# Patient Record
Sex: Male | Born: 1975 | Race: Black or African American | Hispanic: No | Marital: Married | Smoking: Former smoker
Health system: Southern US, Community
[De-identification: ages and names within clinical notes are randomized; demographics above are authoritative.]

## PROBLEM LIST (undated history)

## (undated) DIAGNOSIS — E7800 Pure hypercholesterolemia, unspecified: Secondary | ICD-10-CM

## (undated) HISTORY — PX: WISDOM TOOTH EXTRACTION: SHX21

---

## 2018-04-19 ENCOUNTER — Emergency Department (HOSPITAL_BASED_OUTPATIENT_CLINIC_OR_DEPARTMENT_OTHER): Payer: Managed Care, Other (non HMO)

## 2018-04-19 ENCOUNTER — Emergency Department (HOSPITAL_BASED_OUTPATIENT_CLINIC_OR_DEPARTMENT_OTHER)
Admission: EM | Admit: 2018-04-19 | Discharge: 2018-04-19 | Disposition: A | Discharge status: Home / Self Care | Payer: Managed Care, Other (non HMO) | Attending: Emergency Medicine | Admitting: Emergency Medicine

## 2018-04-19 ENCOUNTER — Encounter (HOSPITAL_BASED_OUTPATIENT_CLINIC_OR_DEPARTMENT_OTHER): Payer: Self-pay | Admitting: Emergency Medicine

## 2018-04-19 ENCOUNTER — Other Ambulatory Visit: Payer: Self-pay

## 2018-04-19 DIAGNOSIS — Z87891 Personal history of nicotine dependence: Secondary | ICD-10-CM | POA: Diagnosis not present

## 2018-04-19 DIAGNOSIS — R072 Precordial pain: Secondary | ICD-10-CM

## 2018-04-19 HISTORY — DX: Pure hypercholesterolemia, unspecified: E78.00

## 2018-04-19 LAB — CBC WITH DIFFERENTIAL/PLATELET
Abs Immature Granulocytes: 0 10*3/uL (ref 0.00–0.07)
Basophils Absolute: 0 10*3/uL (ref 0.0–0.1)
Basophils Relative: 1 %
EOS ABS: 0.3 10*3/uL (ref 0.0–0.5)
EOS PCT: 6 %
HEMATOCRIT: 46.6 % (ref 39.0–52.0)
Hemoglobin: 15.1 g/dL (ref 13.0–17.0)
Immature Granulocytes: 0 %
LYMPHS ABS: 1.5 10*3/uL (ref 0.7–4.0)
Lymphocytes Relative: 33 %
MCH: 30 pg (ref 26.0–34.0)
MCHC: 32.4 g/dL (ref 30.0–36.0)
MCV: 92.5 fL (ref 80.0–100.0)
MONOS PCT: 8 %
Monocytes Absolute: 0.4 10*3/uL (ref 0.1–1.0)
NEUTROS PCT: 52 %
Neutro Abs: 2.4 10*3/uL (ref 1.7–7.7)
PLATELETS: 229 10*3/uL (ref 150–400)
RBC: 5.04 MIL/uL (ref 4.22–5.81)
RDW: 12 % (ref 11.5–15.5)
WBC: 4.6 10*3/uL (ref 4.0–10.5)
nRBC: 0 % (ref 0.0–0.2)

## 2018-04-19 LAB — TROPONIN I: Troponin I: <0.03 ng/mL (ref <0.03)

## 2018-04-19 LAB — BASIC METABOLIC PANEL
ANION GAP: 6 (ref 5–15)
BUN: 17 mg/dL (ref 6–20)
CALCIUM: 9.7 mg/dL (ref 8.9–10.3)
CO2: 27 mmol/L (ref 22–32)
Chloride: 103 mmol/L (ref 98–111)
Creatinine, Ser: 1.03 mg/dL (ref 0.61–1.24)
GFR calc Af Amer: >60 mL/min (ref >60)
GLUCOSE: 85 mg/dL (ref 70–99)
Potassium: 4 mmol/L (ref 3.5–5.1)
Sodium: 136 mmol/L (ref 135–145)

## 2018-04-19 LAB — D-DIMER, QUANTITATIVE: D-Dimer, Quant: 0.29 ug/mL-FEU (ref 0.00–0.50)

## 2018-04-19 MED ORDER — NAPROXEN 250 MG PO TABS
500.0000 mg | ORAL_TABLET | Freq: Once | ORAL | Status: AC
  Administered 2018-04-19: 500 mg via ORAL
  Filled 2018-04-19: qty 2

## 2018-04-19 MED ORDER — GI COCKTAIL ~~LOC~~
30.0000 mL | Freq: Once | ORAL | Status: AC
  Administered 2018-04-19: 30 mL via ORAL
  Filled 2018-04-19: qty 30

## 2018-04-19 NOTE — ED Notes (Signed)
Patient transported to X-ray 

## 2018-04-19 NOTE — ED Triage Notes (Signed)
Pt reports chest pain x 30 mins pta. 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

## 2018-04-19 NOTE — ED Provider Notes (Signed)
[facility] EMERGENCY DEPARTMENT Provider Note   CSN: [id number] Arrival date & time: 04/19/18  0251     History   Chief Complaint Chief Complaint  Patient presents with  . Chest Pain    HPI Ralph Holt. is a 42 y.o. male.  The history is provided by the patient.  Chest Pain   This is a new problem. The current episode started less than 1 hour ago. The problem occurs constantly. The problem has been resolved. 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 The pain is present in the lateral region. The pain is moderate. The quality of the pain is described as brief. Pertinent negatives include no abdominal pain, no back pain, no claudication, no diaphoresis, no exertional chest pressure, no fever, no irregular heartbeat, no near-syncope, no numbness, no orthopnea, no palpitations, no shortness of breath and no vomiting. He has tried nothing for the symptoms. The treatment provided significant relief. Risk factors include male gender.  Pertinent negatives for past medical history include no arrhythmia, no CAD, no diabetes, no DVT, no hyperhomocysteinemia, no hypertension, no MI and no PE.  Pertinent negatives for family medical history include: no Marfan's syndrome.  Procedure history is negative for cardiac catheterization.  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  No leg pain.  No exertional symptoms.  No n/v/d.  No DOE.  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  No long car trips or plane trips.    Past Medical History:  Diagnosis Date  . High cholesterol     There are no active problems to display for this patient.   Past Surgical History:  Procedure Laterality Date  . WISDOM TOOTH EXTRACTION          Home Medications    Prior to Admission medications   Not on File    Family History No family history on file.  Social History Social History   Tobacco Use  . Smoking status: Former Smoker   Types: Cigarettes  . Smokeless tobacco: Never Used  Substance Use Topics  . Alcohol use: Not Currently  . Drug use: Never     Allergies   Patient has no known allergies.   Review of Systems Review of Systems  Constitutional: Negative for diaphoresis and fever.  Respiratory: Negative for chest tightness and shortness of breath.   Cardiovascular: Positive for chest pain. Negative for palpitations, orthopnea, claudication, leg swelling and near-syncope.  Gastrointestinal: Negative for abdominal pain and vomiting.  Musculoskeletal: Negative for back pain and neck pain.  Neurological: Negative for numbness.  All other systems reviewed and are negative.    Physical Exam Updated Vital Signs BP 124/86 (BP Location: Right Arm)   Pulse (!) 52   Temp 98.1 F (36.7 C) (Oral)   Resp 18   Ht 5\' 3"  (1.6 m)   Wt 76.2 kg   SpO2 100%   BMI 29.76 kg/m   Physical Exam  Constitutional: He is oriented to person, place, and time. He appears well-developed and well-nourished. No distress.  HENT:  Head: Normocephalic and atraumatic.  Mouth/Throat: No oropharyngeal exudate.  Eyes: Pupils are equal, round, and reactive to light. Conjunctivae are normal.  Neck: Normal range of motion. Neck supple.  Cardiovascular: Normal rate, regular rhythm, normal heart sounds and intact distal pulses.  Pulmonary/Chest: Effort normal and breath sounds normal. No stridor. No respiratory distress. He has no wheezes. He has no rales.  Abdominal: Soft.  Bowel sounds are normal. He exhibits no mass. There is no tenderness. There is no rebound and no guarding.  Musculoskeletal: Normal range of motion. He exhibits no edema or tenderness.  Neurological: He is alert and oriented to person, place, and time.  Skin: Skin is warm and dry. Capillary refill takes less than 2 seconds. He is not diaphoretic.  Psychiatric: He has a normal mood and affect.     ED Treatments / Results  Labs (all labs ordered are listed, but  only abnormal results are displayed) Labs Reviewed  CBC WITH DIFFERENTIAL/PLATELET  BASIC METABOLIC PANEL  TROPONIN I  D-DIMER, QUANTITATIVE (NOT AT [facility])    EKG EKG Interpretation  Date/Time:  Wednesday April 19 2018 03:00:01 EDT Ventricular Rate:  49 PR Interval:    QRS Duration: 88 QT Interval:  385 QTC Calculation: 348 R Axis:   81 Text Interpretation:  Sinus bradycardia Confirmed by Cecily Lawhorne ([id number]) on 04/19/2018 3:02:02 AM   Radiology No results found.  Procedures Procedures (including critical care time)  Medications Ordered in ED Medications - No data to display   Initial Impression / Assessment and Plan / ED Course   Ruled out for PE with negative ddimer in the ED. Also ruled out for MI with 2 negative troponins.  HEART score is 1 very low risk for MACE.  Symptoms are consistent with MSK pain pain and will adveise NSAIDS and heat.      Final Clinical Impressions(s) / ED Diagnoses   Return for weakness, numbness, changes in vision or speech, fevers >100.4 unrelieved by medication, shortness of breath, intractable vomiting, or diarrhea, abdominal pain, Inability to tolerate liquids or food, cough, altered mental status or any concerns. No signs of systemic illness or infection. The patient is nontoxic-appearing on exam and vital signs are within normal limits.    I have reviewed the triage vital signs and the nursing notes. Pertinent labs &imaging results that were available during my care of the patient were reviewed by me and considered in my medical decision making (see chart for details).  After history, exam, and medical workup I feel the patient has been appropriately medically screened and is safe for discharge home. Pertinent diagnoses were discussed with the patient. Patient was given return precautions.   Anu Stagner, MD 04/19/18 [phone]

## 2020-02-29 IMAGING — DX DG CHEST 2V
2 series · 2 of 2 positions shown · non-contrast
Comparison: None.

CLINICAL DATA: Chest pain

EXAM:
CHEST - 2 VIEW

[chest pa]
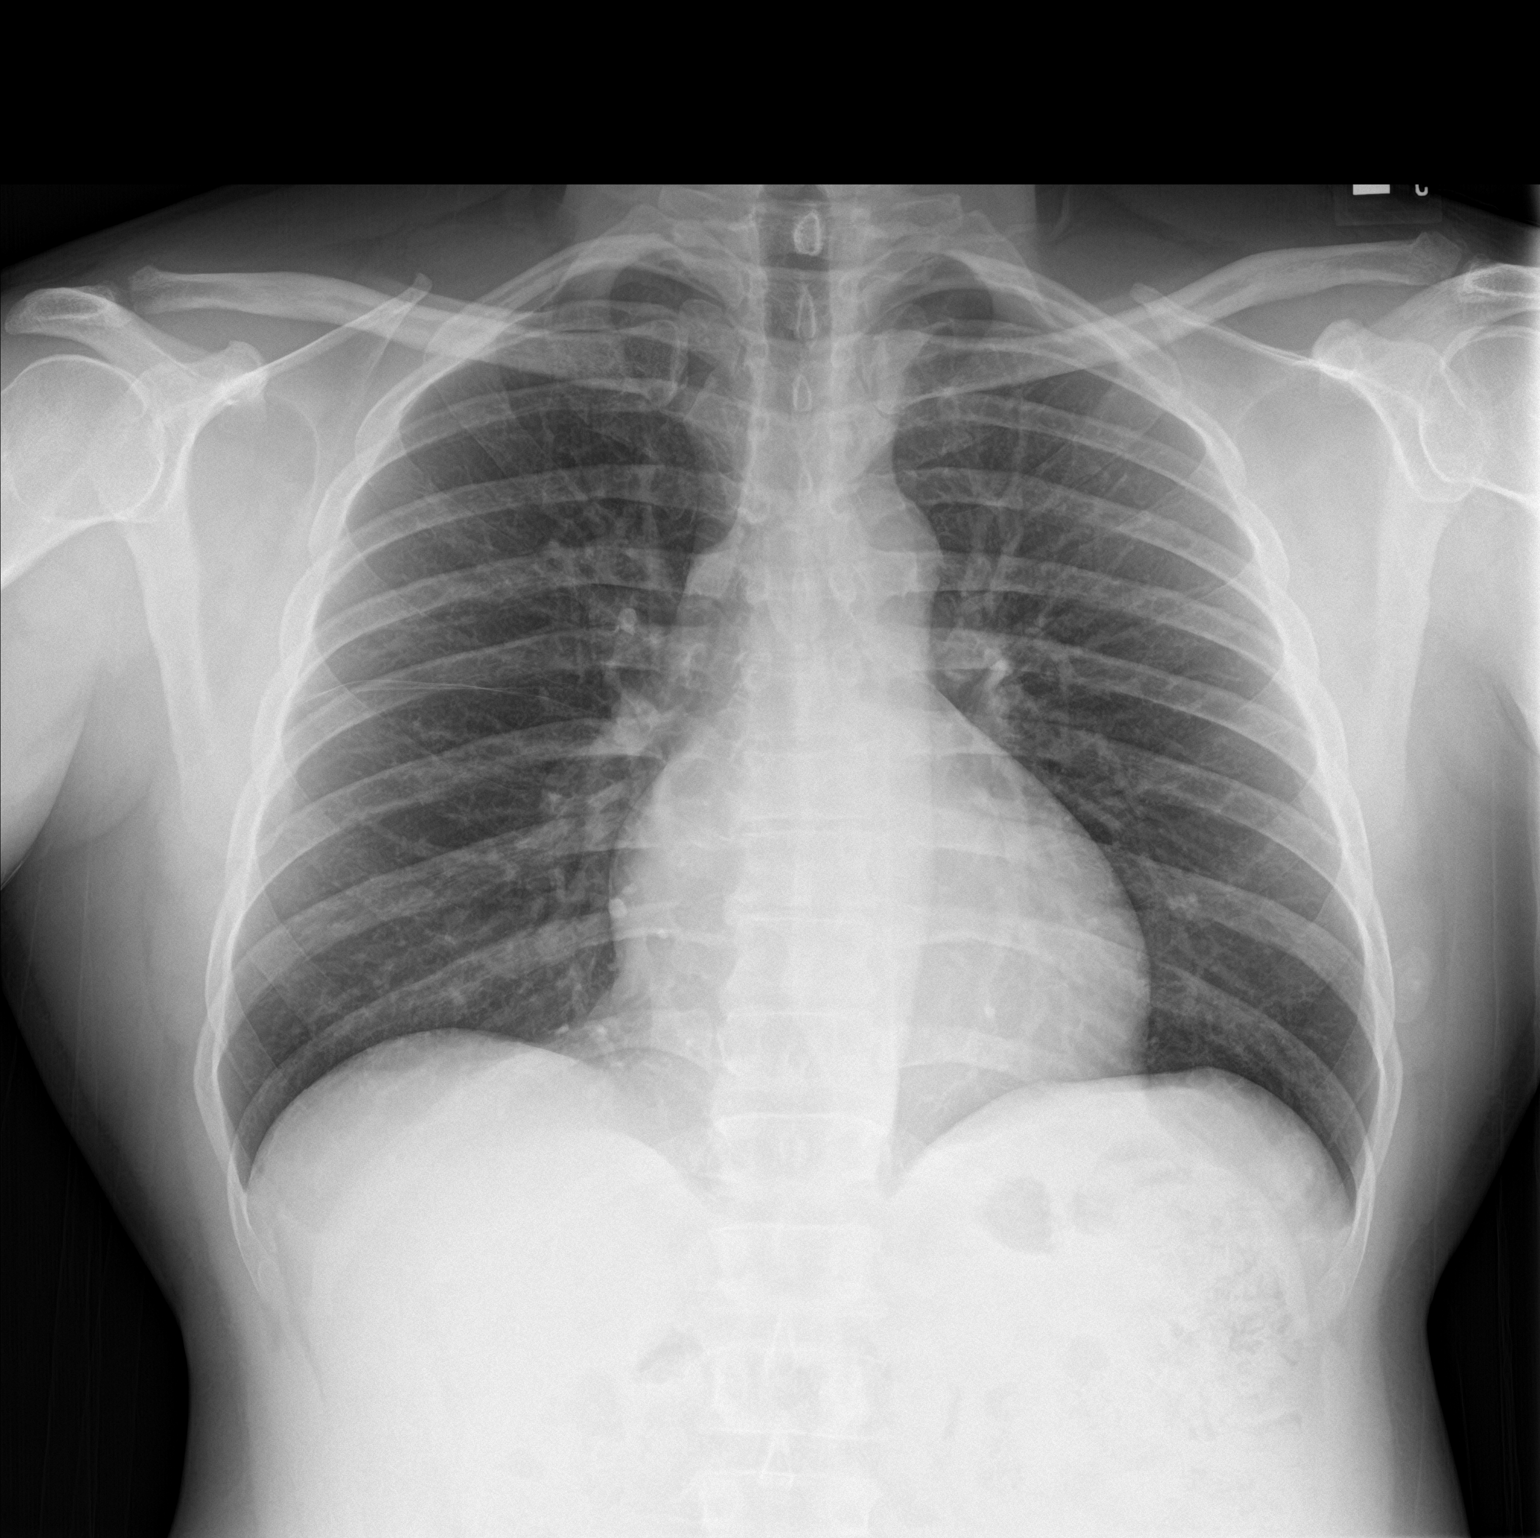

[chest lat]
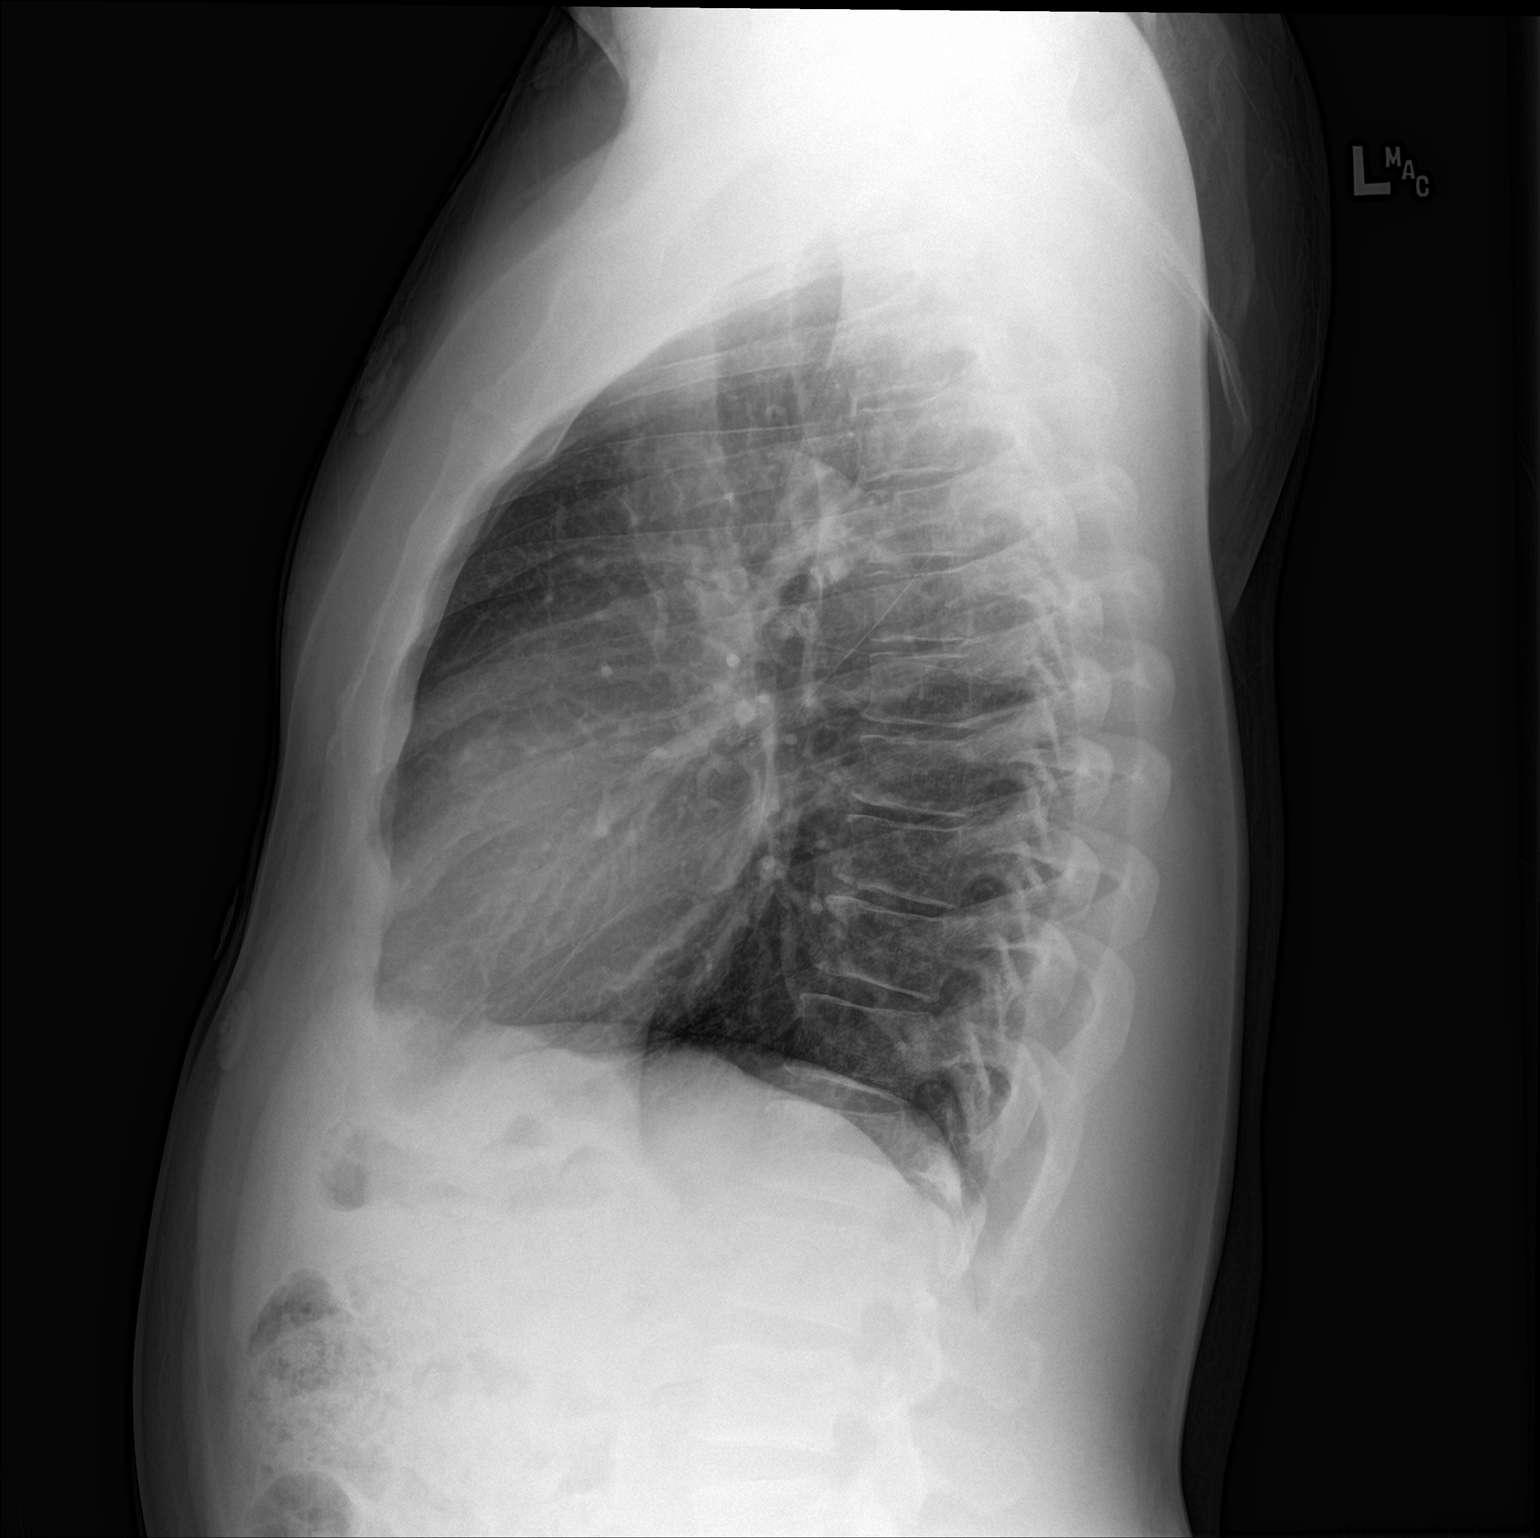

[2 of 2 positions shown; findings below may reference images not displayed]

FINDINGS: The heart size and mediastinal contours are within normal limits.
Both lungs are clear. The visualized skeletal structures are
unremarkable.
IMPRESSION: Normal chest.
